# Patient Record
Sex: Male | Born: 1980 | Race: Black or African American | Hispanic: No | Marital: Single
Health system: Southern US, Community
[De-identification: ages and names within clinical notes are randomized; demographics above are authoritative.]

---

## 2005-02-13 ENCOUNTER — Emergency Department: Payer: Self-pay | Admitting: Emergency Medicine

## 2010-03-30 ENCOUNTER — Emergency Department: Payer: Self-pay | Admitting: Emergency Medicine

## 2010-08-08 ENCOUNTER — Emergency Department: Payer: Self-pay | Admitting: Internal Medicine

## 2010-08-10 ENCOUNTER — Emergency Department: Payer: Self-pay | Admitting: Internal Medicine
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
## 2011-08-29 ENCOUNTER — Emergency Department: Payer: Self-pay | Admitting: Emergency Medicine

## 2014-05-10 ENCOUNTER — Emergency Department: Payer: Self-pay | Admitting: Emergency Medicine

## 2022-02-19 ENCOUNTER — Other Ambulatory Visit: Payer: Self-pay

## 2022-02-19 DIAGNOSIS — Z760 Encounter for issue of repeat prescription: Secondary | ICD-10-CM | POA: Insufficient documentation

## 2022-02-19 DIAGNOSIS — I10 Essential (primary) hypertension: Secondary | ICD-10-CM | POA: Insufficient documentation

## 2022-02-19 DIAGNOSIS — F419 Anxiety disorder, unspecified: Secondary | ICD-10-CM | POA: Diagnosis present

## 2022-02-19 NOTE — ED Triage Notes (Signed)
First Nurse note:  Pt to ED via EMS from home called out for psych call by patient voluntarily c/o depression and anxiety x2 days and not taking meds for last month.  EMS states he wants his chronic HTN checked and to have a shot of abilify.  Denies SI/HI. ?

## 2022-02-19 NOTE — ED Triage Notes (Signed)
Pt states he has been anxious. Pt states he takes abilify monthly and has not had in a month and a half. Pt states he would like something to eat while he is here. Pt denies SI or HI.  ?

## 2022-02-20 ENCOUNTER — Encounter: Payer: Self-pay | Admitting: Emergency Medicine

## 2022-02-20 ENCOUNTER — Emergency Department
Admission: EM | Admit: 2022-02-20 | Discharge: 2022-02-20 | Disposition: A | Discharge status: Home / Self Care | Payer: No Typology Code available for payment source | Attending: Emergency Medicine | Admitting: Emergency Medicine

## 2022-02-20 DIAGNOSIS — F419 Anxiety disorder, unspecified: Secondary | ICD-10-CM

## 2022-02-20 DIAGNOSIS — F1490 Cocaine use, unspecified, uncomplicated: Secondary | ICD-10-CM

## 2022-02-20 DIAGNOSIS — Z760 Encounter for issue of repeat prescription: Secondary | ICD-10-CM

## 2022-02-20 MED ORDER — ARIPIPRAZOLE ER 400 MG IM SRER
400.0000 mg | Freq: Once | INTRAMUSCULAR | Status: AC
  Administered 2022-02-20: 400 mg via INTRAMUSCULAR
  Filled 2022-02-20: qty 2

## 2022-02-20 NOTE — ED Provider Notes (Signed)
? ?[facility] ?Provider Note ? ? ? Event Date/Time  ? First MD Initiated Contact with Patient 02/20/22 0059   ?  (approximate) ? ? ?History  ? ?Anxiety ? ? ?HPI ? ?Jon Stein is a 41 y.o. male with history of hypertension and medical noncompliance here requesting medications for his blood pressure and his Abilify injection which she states is overdue.  He states that he feels like his anxiety is out of control but denies SI, HI or hallucinations.  No headache, vision changes, numbness, weakness, chest pain or shortness of breath.  His blood pressure is currently normal.  He does state that he used cocaine yesterday.  He states that he has not been on blood pressure medication in years.  He states he does not have outpatient primary care follow-up. ? ? ?History provided by patient. ? ? ? ?History reviewed. No pertinent past medical history. ? ?History reviewed. No pertinent surgical history. ? ?MEDICATIONS:  ?Prior to Admission medications   ?Not on File  ? ? ?Physical Exam  ? ?Triage Vital Signs: ?ED Triage Vitals  ?Enc Vitals Group  ?   BP 02/19/22 2339 (!) 154/97  ?   Pulse Rate 02/19/22 2339 82  ?   Resp 02/19/22 2339 18  ?   Temp 02/19/22 2339 98.2 ?F (36.8 ?C)  ?   Temp src --   ?   SpO2 02/19/22 2339 99 %  ?   Weight 02/19/22 2339 170 lb (77.1 kg)  ?   Height 02/19/22 2339 5\' 3"  (1.6 m)  ?   Head Circumference --   ?   Peak Flow --   ?   Pain Score 02/19/22 2338 0  ?   Pain Loc --   ?   Pain Edu? --   ?   Excl. in [place]? --   ? ? ?Most recent vital signs: ?Vitals:  ? 02/19/22 2339 02/20/22 0342  ?BP: (!) 154/97 139/81  ?Pulse: 82 76  ?Resp: 18 17  ?Temp: 98.2 ?F (36.8 ?C) 98.1 ?F (36.7 ?C)  ?SpO2: 99% 100%  ? ? ?CONSTITUTIONAL: Alert and oriented and responds appropriately to questions. Well-appearing; well-nourished ?HEAD: Normocephalic, atraumatic ?EYES: Conjunctivae clear, pupils appear equal, sclera nonicteric ?ENT: normal nose; moist mucous membranes ?NECK: Supple, normal  ROM ?CARD: RRR; S1 and S2 appreciated; no murmurs, no clicks, no rubs, no gallops ?RESP: Normal chest excursion without splinting or tachypnea; breath sounds clear and equal bilaterally; no wheezes, no rhonchi, no rales, no hypoxia or respiratory distress, speaking full sentences ?ABD/GI: Normal bowel sounds; non-distended; soft, non-tender, no rebound, no guarding, no peritoneal signs ?BACK: The back appears normal ?EXT: Normal ROM in all joints; no deformity noted, no edema; no cyanosis ?SKIN: Normal color for age and race; warm; no rash on exposed skin ?NEURO: Moves all extremities equally, normal speech, normal gait, no facial asymmetry ?PSYCH: The patient's mood and manner are appropriate.  No SI, HI.  No hallucinations.  Not responding to internal stimuli. ? ? ?ED Results / Procedures / Treatments  ? ?LABS: ?(all labs ordered are listed, but only abnormal results are displayed) ?Labs Reviewed - No data to display ? ? ?EKG: ? ?RADIOLOGY: ?My personal review and interpretation of imaging:   ? ?I have personally reviewed all radiology reports.   ?No results found. ? ? ?PROCEDURES: ? ?Critical Care performed: No ? ? ?CRITICAL CARE ?Performed by: Cyril Mourning Kailynn Satterly ? ? ?Total critical care time: 0 minutes ? ?Critical care time  was exclusive of separately billable procedures and treating other patients. ? ?Critical care was necessary to treat or prevent imminent or life-threatening deterioration. ? ?Critical care was time spent personally by me on the following activities: development of treatment plan with patient and/or surrogate as well as nursing, discussions with consultants, evaluation of patient's response to treatment, examination of patient, obtaining history from patient or surrogate, ordering and performing treatments and interventions, ordering and review of laboratory studies, ordering and review of radiographic studies, pulse oximetry and re-evaluation of patient's  condition. ? ? ?Procedures ? ? ? ?IMPRESSION / MDM / ASSESSMENT AND PLAN / ED COURSE  ?I reviewed the triage vital signs and the nursing notes. ? ? ? ?Patient here with asking for his Abilify 400 mcg IM injection.  He states that he feels that anxiety is getting out of control but denies SI, HI or hallucinations and does not want to speak to psychiatry.  Also requesting something for his blood pressure but is asymptomatic with normal blood pressures currently.  Has not been on blood pressure medication in years. ? ? ? ? ?DIFFERENTIAL DIAGNOSIS (includes but not limited to):   Medication refill, anxiety, asymptomatic hypertension ? ? ?PLAN: We will give him his dose of IM Abilify but I do not feel he needs emergent psychiatric evaluation today.  He has no SI, HI or hallucinations. ? ?He also states that he has been without blood pressure medication for years but currently his blood pressure is 139/81 and he is asymptomatic.  We discussed that cocaine can cause his blood pressure to go up but given it is normal now and he is asymptomatic that I do not feel it is appropriate for me to start him on blood pressure medication again given he is normotensive.  I have recommended that he follow-up with her primary care doctor and avoid illicit drugs. ? ?Have given him outpatient PCP as well as psychiatric follow-up information. ? ? ?MEDICATIONS GIVEN IN ED: ?Medications  ?ARIPiprazole ER (ABILIFY MAINTENA) injection 400 mg (400 mg Intramuscular Given 02/20/22 0336)  ? ? ? ?ED COURSE:  At this time, I do not feel there is any life-threatening condition present. I reviewed all nursing notes, vitals, pertinent previous records.  All lab and urine results, EKGs, imaging ordered have been independently reviewed and interpreted by myself.  I reviewed all available radiology reports from any imaging ordered this visit.  Based on my assessment, I feel the patient is safe to be discharged home without further emergent workup and can  continue workup as an outpatient as needed. Discussed all findings, treatment plan as well as usual and customary return precautions with patient.  They verbalize understanding and are comfortable with this plan.  Outpatient follow-up has been provided as needed.  All questions have been answered. ? ? ? ?CONSULTS: No emergent psychiatric consult needed at this time. ? ? ?OUTSIDE RECORDS REVIEWED: No previous records for review. ? ? ? ? ? ? ? ? ?FINAL CLINICAL IMPRESSION(S) / ED DIAGNOSES  ? ?Final diagnoses:  ?Anxiety  ?Medication refill  ? ? ? ?Rx / DC Orders  ? ?ED Discharge Orders   ? ? None  ? ?  ? ? ? ?Note:  This document was prepared using Dragon voice recognition software and may include unintentional dictation errors. ?  ?Daren Doswell, Delice Bison, DO ?02/20/22 0347 ? ?

## 2022-02-20 NOTE — Discharge Instructions (Signed)
Steps to find a Primary Care Provider (PCP):  Call [phone] or [phone] to access "[place] Find a Doctor Service."  2.  You may also go on the [facility] website at [email or web address]  

## 2022-02-20 NOTE — ED Notes (Signed)
Patient denies SI/HI/AVH. Patient states he getting psych services at [facility] and receives a Ablify shot once a month but has not received it in over a month. Patient states he needs his shot or the pills until he can get back in with [facility] to obtain shot. Patient demanding snack tray stating, "I have sugar and need food."  Patient became upset with Clinical research associate due to Clinical research associate not giving patient another sandwich tray or crackers at this time.  ?

## 2022-02-20 NOTE — ED Notes (Signed)
ED Provider at bedside. 

## 2022-02-20 NOTE — ED Notes (Signed)
Pt given sandwich tray and water 

## 2024-08-23 ENCOUNTER — Other Ambulatory Visit: Payer: Self-pay | Admitting: Physician Assistant

## 2024-08-23 DIAGNOSIS — B1920 Unspecified viral hepatitis C without hepatic coma: Secondary | ICD-10-CM

## 2024-08-24 ENCOUNTER — Encounter: Payer: Self-pay | Admitting: Family Medicine

## 2024-09-03 ENCOUNTER — Ambulatory Visit
Admission: RE | Admit: 2024-09-03 | Discharge: 2024-09-03 | Disposition: A | Discharge status: Home / Self Care | Payer: MEDICAID | Source: Ambulatory Visit | Attending: Physician Assistant | Admitting: Physician Assistant

## 2024-09-03 DIAGNOSIS — B1920 Unspecified viral hepatitis C without hepatic coma: Secondary | ICD-10-CM | POA: Diagnosis present

## 2024-09-06 NOTE — Progress Notes (Signed)
 Pt has PCP. Pt given resources for SDOH needs.

## 2024-09-11 ENCOUNTER — Ambulatory Visit (LOCAL_COMMUNITY_HEALTH_CENTER): Payer: MEDICAID

## 2024-09-11 DIAGNOSIS — Z111 Encounter for screening for respiratory tuberculosis: Secondary | ICD-10-CM

## 2024-09-13 ENCOUNTER — Ambulatory Visit (LOCAL_COMMUNITY_HEALTH_CENTER): Payer: MEDICAID

## 2024-09-13 DIAGNOSIS — Z111 Encounter for screening for respiratory tuberculosis: Secondary | ICD-10-CM

## 2024-09-13 LAB — TB SKIN TEST
Induration: 0 mm
TB Skin Test: NEGATIVE
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
